# Patient Record
Sex: Female | Born: 1965 | Race: White | Hispanic: No | Marital: Single
Health system: Midwestern US, Community
[De-identification: ages and names within clinical notes are randomized; demographics above are authoritative.]

## PROBLEM LIST (undated history)

## (undated) DIAGNOSIS — M797 Fibromyalgia: Secondary | ICD-10-CM

## (undated) DIAGNOSIS — K859 Acute pancreatitis without necrosis or infection, unspecified: Secondary | ICD-10-CM

## (undated) HISTORY — PX: CHOLECYSTECTOMY: SHX55

## (undated) HISTORY — PX: ABDOMINAL HYSTERECTOMY: SHX81

## (undated) HISTORY — PX: GASTROINTESTINAL STENT REMOVAL: SHX6384

## (undated) HISTORY — PX: APPENDECTOMY: SHX54

---

## 2014-05-24 ENCOUNTER — Inpatient Hospital Stay
Admit: 2014-05-24 | Discharge: 2014-05-24 | Disposition: A | Discharge status: Home / Self Care | Payer: MEDICAID | Attending: Emergency Medicine

## 2014-05-24 DIAGNOSIS — R51 Headache: Secondary | ICD-10-CM

## 2014-05-24 MED ORDER — METHOCARBAMOL 500 MG TAB
500 mg | ORAL_TABLET | Freq: Four times a day (QID) | ORAL | Status: DC
Start: 2014-05-24 — End: 2014-06-10

## 2014-05-24 MED ORDER — OXYCODONE-ACETAMINOPHEN 5 MG-325 MG TAB
5-325 mg | ORAL_TABLET | ORAL | Status: DC
Start: 2014-05-24 — End: 2014-06-10

## 2014-05-24 MED ORDER — ONDANSETRON 4 MG TAB, RAPID DISSOLVE
4 mg | ORAL_TABLET | Freq: Three times a day (TID) | ORAL | Status: AC | PRN
Start: 2014-05-24 — End: 2014-05-28

## 2014-05-24 NOTE — ED Notes (Signed)
Pt examined by Dr. Thana Farrartmill and cleared for DC. VSS. HA pain still 9/10. Patient is awake, alert, and oriented, speech is clear and patient is able to ambulate. Pt is ready for discharge. Verbal and written discharge instructions provided and pt has cognitive understanding of discharge instructions. Discharged home via ambulatory. All questions answered.

## 2014-05-24 NOTE — ED Notes (Addendum)
Pt c/o HA x3 days. + Vomiting since this am. Lights and sounds not bothering pt. + hx "muscle tension HA's". 6 months since HA this bad. Pt drove self here and no one can pick her up. Pt states she really just wants Rx for HA and vomiting to go. Pt just moved to area and [facility]. Pt tried Butalbital and Tyl she usually takes with no relief.

## 2014-05-24 NOTE — ED Provider Notes (Signed)
Patient is a 48 y.o. female presenting with headaches. The history is provided by the patient.   Headache  This is a recurrent problem. The current episode started more than 2 days ago. Associated symptoms include headaches. Pertinent negatives include no chest pain and no shortness of breath. Nothing aggravates the symptoms. Nothing relieves the symptoms.    Patient has been having 3 days of worsening diffuse headache with nausea similar to previous headaches.  No photophobia.  She is driving and does not want medication here     History reviewed. No pertinent past medical history.     Past Surgical History   Procedure Laterality Date   ??? Hx total abdominal hysterectomy     ??? Hx appendectomy     ??? Hx cholecystectomy           History reviewed. No pertinent family history.     History     Social History   ??? Marital Status: SINGLE     Spouse Name: N/A     Number of Children: N/A   ??? Years of Education: N/A     Occupational History   ??? Not on file.     Social History Main Topics   ??? Smoking status: Never Smoker    ??? Smokeless tobacco: Not on file   ??? Alcohol Use: Yes      Comment: occ   ??? Drug Use: No   ??? Sexual Activity: Not on file     Other Topics Concern   ??? Not on file     Social History Narrative   ??? No narrative on file                  ALLERGIES: Compazine; Nsaids (non-steroidal anti-inflammatory drug); Phenergan; and Toradol      Review of Systems   Eyes: Negative for pain.   Respiratory: Negative for cough, choking, chest tightness and shortness of breath.    Cardiovascular: Negative for chest pain and leg swelling.   Gastrointestinal: Negative for constipation, abdominal distention and anal bleeding.   Endocrine: Negative for cold intolerance, heat intolerance and polydipsia.   Genitourinary: Negative for frequency, hematuria, enuresis and pelvic pain.   Musculoskeletal: Negative for back pain, joint swelling, arthralgias and gait problem.   Skin: Negative for color change and pallor.    Allergic/Immunologic: Negative for environmental allergies and food allergies.   Neurological: Positive for headaches.       Filed Vitals:    05/24/14 1857   BP: 114/80   Pulse: 94   Temp: 98.7 ??F (37.1 ??C)   Resp: 20   Height: 5\' 9"  (1.753 m)   Weight: 61.236 kg (135 lb)   SpO2: 100%            Physical Exam   Constitutional: She is oriented to person, place, and time. She appears well-developed and well-nourished. No distress.   HENT:   Head: Normocephalic and atraumatic.   Mouth/Throat: Oropharynx is clear and moist.   Eyes: EOM are normal. Pupils are equal, round, and reactive to light.   Neck: Neck supple. No JVD present.   Cardiovascular: Normal rate, regular rhythm and normal heart sounds.    Pulmonary/Chest: Breath sounds normal. No respiratory distress.   Abdominal: Soft. Bowel sounds are normal. She exhibits no distension. There is no tenderness.   Musculoskeletal: Normal range of motion. She exhibits no edema.   Neurological: She is alert and oriented to person, place, and time. No cranial nerve deficit.  Skin: Skin is warm and dry. She is not diaphoretic. No pallor.   Nursing note and vitals reviewed.       MDM    Procedures    <EMERGENCY DEPARTMENT CASE SUMMARY>    Impression/Differential Diagnosis: Headache      ED Course: Patient while pick or scripts and return for worsening sx    Final Impression/Diagnosis: Headache    Patient condition at time of disposition: Stable      I have reviewed the following home medications:    Prior to Admission medications    Medication Sig Start Date End Date Taking? Authorizing Provider   butalbital-acetaminophen (PHRENILIN) 50-325 mg tablet Take  by mouth every six (6) hours as needed.   Yes Phys Other, MD   oxyCODONE-acetaminophen (PERCOCET) 5-325 mg per tablet 1 Tab Oral every 4 - 6 hours with food as needed for pain 05/24/14  Yes Leonia CoronaKeith W Lalonnie Shaffer, MD   methocarbamol (ROBAXIN) 500 mg tablet Take 1 Tab by mouth four (4) times  daily. 05/24/14  Yes Leonia CoronaKeith W Shantana Christon, MD   ondansetron (ZOFRAN ODT) 4 mg disintegrating tablet Take 1 Tab by mouth every eight (8) hours as needed for Nausea for up to 4 days. 05/24/14 05/28/14 Yes Leonia CoronaKeith W Vidur Knust, MD         Leonia CoronaKeith W Promise Weldin, MD

## 2014-06-10 ENCOUNTER — Emergency Department: Admit: 2014-06-11 | Payer: MEDICAID | Primary: Family Medicine

## 2014-06-10 ENCOUNTER — Inpatient Hospital Stay
Admit: 2014-06-10 | Discharge: 2014-06-11 | Disposition: A | Discharge status: Home / Self Care | Payer: MEDICAID | Attending: Emergency Medicine

## 2014-06-10 DIAGNOSIS — R109 Unspecified abdominal pain: Secondary | ICD-10-CM

## 2014-06-10 MED ORDER — SODIUM CHLORIDE 0.9 % IJ SYRG
INTRAMUSCULAR | Status: DC | PRN
Start: 2014-06-10 — End: 2014-06-11
  Administered 2014-06-11: 02:00:00 via INTRAVENOUS

## 2014-06-10 MED ORDER — HYDROMORPHONE (PF) 1 MG/ML IJ SOLN
1 mg/mL | Freq: Once | INTRAMUSCULAR | Status: AC
Start: 2014-06-10 — End: 2014-06-10
  Administered 2014-06-11: via INTRAVENOUS

## 2014-06-10 MED ORDER — ONDANSETRON (PF) 4 MG/2 ML INJECTION
4 mg/2 mL | Freq: Once | INTRAMUSCULAR | Status: AC
Start: 2014-06-10 — End: 2014-06-10
  Administered 2014-06-11: via INTRAVENOUS

## 2014-06-10 MED ORDER — SODIUM CHLORIDE 0.9% BOLUS IV
0.9 % | Freq: Once | INTRAVENOUS | Status: AC
Start: 2014-06-10 — End: 2014-06-11
  Administered 2014-06-11: via INTRAVENOUS

## 2014-06-10 MED FILL — SODIUM CHLORIDE 0.9 % IV: INTRAVENOUS | Qty: 1000

## 2014-06-10 MED FILL — NORMAL SALINE FLUSH 0.9 % INJECTION SYRINGE: INTRAMUSCULAR | Qty: 10

## 2014-06-10 NOTE — ED Notes (Signed)
Patient presents to ED for evaluation of lower abdominal pain and vomiting starting around 0200 this AM. Patient thought she was constipated took a ducalox had a small BM today. States whenever she sits to have BM feels like "my bladder is on fire". Driven by husband. Unable to tolerate PO.

## 2014-06-10 NOTE — ED Notes (Signed)
Pt medicated  As ordered   Iv infusing

## 2014-06-10 NOTE — ED Notes (Signed)
Pt can obnly give scant urine

## 2014-06-10 NOTE — ED Provider Notes (Addendum)
Patient is a 48 y.o. female presenting with abdominal pain and vomiting. The history is provided by the patient.   Abdominal Pain   This is a new problem. The current episode started yesterday. The problem occurs constantly. The problem has been gradually worsening. The pain is associated with vomiting. The pain is located in the LLQ. The quality of the pain is cramping. The pain is moderate. Associated symptoms include nausea and vomiting. Pertinent negatives include no anorexia, no fever, no belching, no diarrhea, no flatus, no hematochezia, no melena, no constipation, no dysuria, no frequency, no hematuria, no headaches, no arthralgias, no trauma, no testicular pain and no back pain.   Vomiting   Associated symptoms include abdominal pain. Pertinent negatives include no chills, no fever, no diarrhea, no headaches, no arthralgias and no headaches.    Patient has been having worsening cramping lower and llq moderate abdominal pain since yesterday with nv and less stools than normal.                                  History reviewed. No pertinent past medical history.;     Past Surgical History   Procedure Laterality Date   ??? Hx total abdominal hysterectomy     ??? Hx appendectomy     ??? Hx cholecystectomy           History reviewed. No pertinent family history.     History     Social History   ??? Marital Status: SINGLE     Spouse Name: N/A     Number of Children: N/A   ??? Years of Education: N/A     Occupational History   ??? Not on file.     Social History Main Topics   ??? Smoking status: Never Smoker    ??? Smokeless tobacco: Not on file   ??? Alcohol Use: Yes      Comment: rarely   ??? Drug Use: No   ??? Sexual Activity: Not on file     Other Topics Concern   ??? Not on file     Social History Narrative                  ALLERGIES: Compazine; Nsaids (non-steroidal anti-inflammatory drug); Phenergan; and Toradol      Review of Systems   Constitutional: Negative for fever, chills, diaphoresis and appetite change.    HENT: Negative for ear pain.    Eyes: Negative for pain and discharge.   Gastrointestinal: Positive for nausea, vomiting and abdominal pain. Negative for diarrhea, constipation, melena, hematochezia, anorexia and flatus.   Endocrine: Negative for cold intolerance and heat intolerance.   Genitourinary: Negative for dysuria, frequency, hematuria and testicular pain.   Musculoskeletal: Negative for back pain and arthralgias.   Skin: Negative for color change, pallor and rash.   Allergic/Immunologic: Negative for environmental allergies and food allergies.   Neurological: Negative for dizziness, facial asymmetry and headaches.   Hematological: Negative for adenopathy. Does not bruise/bleed easily.   Psychiatric/Behavioral: Negative for confusion, sleep disturbance, self-injury, dysphoric mood, decreased concentration and agitation.       Filed Vitals:    06/10/14 1928   BP: 123/78   Pulse: 87   Temp: 98.4 ??F (36.9 ??C)   Resp: 18   Height: 5' 9"  (1.753 m)   Weight: 61.236 kg (135 lb)   SpO2: 100%  Physical Exam   Constitutional: She is oriented to person, place, and time. She appears well-developed and well-nourished. No distress.   HENT:   Head: Normocephalic and atraumatic.   Mouth/Throat: Oropharynx is clear and moist.   Eyes: EOM are normal. Pupils are equal, round, and reactive to light.   Neck: Neck supple. No JVD present.   Cardiovascular: Normal rate, regular rhythm and normal heart sounds.    Pulmonary/Chest: Breath sounds normal. No respiratory distress.   Abdominal: Soft. Bowel sounds are normal. She exhibits no distension. There is tenderness (LLQ ttp).   Musculoskeletal: Normal range of motion. She exhibits no edema.   Neurological: She is alert and oriented to person, place, and time. No cranial nerve deficit.   Skin: Skin is warm and dry. She is not diaphoretic. No pallor.   Nursing note and vitals reviewed.       MDM  Number of Diagnoses or Management Options   Diagnosis management comments: The patient presents with abdominal pain with a differential diagnosis of abdominal pain, appendicitis, biliary colic, cholecystitis, diverticulitis, gastritis, gastroenteritis, pancreatitis, PUD, renal Colic, UTI and vomiting         Amount and/or Complexity of Data Reviewed  Clinical lab tests: reviewed and ordered  Tests in the radiology section of CPT??: reviewed and ordered  Tests in the medicine section of CPT??: reviewed and ordered    Risk of Complications, Morbidity, and/or Mortality  Presenting problems: high  Diagnostic procedures: high  Management options: high    Patient Progress  Patient progress: improved                          Procedures      EXAM:  CT Abdomen and Pelvis With Intravenous Contrast.  CLINICAL HISTORY:  48 years old, female; Abdominal pain  TECHNIQUE:  Axial computed tomography images of the abdomen and pelvis with intravenous   contrast.  Coronal and sagittal reformatted images were created and reviewed.  EXAM DATE/TIME:  Exam ordered 06/10/2014 8:33 PM  COMPARISON:  No relevant prior studies available.  FINDINGS:  Lower thorax: No acute findings.  ABDOMEN:  Liver: Unremarkable. No mass.  Gallbladder and bile ducts: Intrahepatic and extrahepatic biliary dilatation.  Common bile duct up to 16 mm. Cholecystectomy.  Pancreas: Distal pancreatic ductal dilatation up to 7 mm.  Spleen: Unremarkable. No splenomegaly.  Adrenals: Unremarkable. No mass.  Kidneys and ureters: Unremarkable. No hydronephrosis. No solid mass.  PELVIS:  Bladder: Unremarkable. No mass.  Reproductive: Hysterectomy.  Appendix: No findings to suggest acute appendicitis.  ABDOMEN & PELVIS:  Stomach and bowel: Copious stool in the left colon. Descending colon wall   thickening and loss of haustration. No obstruction.  Peritoneum: Unremarkable. No significant fluid collection. No free air.  Lymph nodes: Unremarkable. No enlarged lymph nodes.  Vasculature: Unremarkable. No aortic aneurysm.   Bones: No acute fracture.  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 Correlation   to clinical and laboratory parameters is suggested. Daytime MR and MRCP   suggested, if clinically warranted.   Constipation cannot be excluded.  Likely descending colitis.  THIS DOCUMENT HAS BEEN ELECTRONICALLY SIGNED BY ASTI [place] MD    Recent Results (from the past 12 hour(s))   URINALYSIS W/ RFLX MICROSCOPIC    Collection Time: 06/10/14  8:00 PM   Result Value Ref Range    Color YELLOW YEL      Appearance CLEAR CLEAR  Specific gravity >1.030 (H) 1.005 - 1.030    pH (UA) 6.0 4.5 - 8.0      Protein NEGATIVE  NEG mg/dL    Glucose NEGATIVE  NEG mg/dL    Ketone NEGATIVE  NEG mg/dL    Bilirubin NEGATIVE  NEG      Blood NEGATIVE  NEG      Urobilinogen 0.2 0.1 - 1.0 EU/dL    Nitrites NEGATIVE  NEG      Leukocyte Esterase NEGATIVE  NEG     CBC WITH AUTOMATED DIFF    Collection Time: 06/10/14  8:08 PM   Result Value Ref Range    WBC 12.1 (H) 4.8 - 10.8 K/uL    RBC 3.86 (L) 4.20 - 5.40 M/uL    HGB 12.0 12.0 - 16.0 g/dL    HCT 36.4 (L) 37.0 - 47.0 %    MCV 94.3 81.0 - 100.0 FL    MCH 31.1 (H) 27.0 - 31.0 PG    MCHC 33.0 30.5 - 36.0 g/dL    RDW 13.2 11.4 - 14.6 %    PLATELET 269 122 - 400 K/uL    MPV 10.3 10.2 - 12.7 FL    NEUTROPHILS PENDING %    LYMPHOCYTES PENDING %    MONOCYTES PENDING %    EOSINOPHILS PENDING %    BASOPHILS PENDING %    ABS. NEUTROPHILS PENDING K/UL    ABS. LYMPHOCYTES PENDING K/UL    ABS. MONOCYTES PENDING K/UL    ABS. EOSINOPHILS PENDING K/UL    ABS. BASOPHILS PENDING K/UL    DF PENDING    METABOLIC PANEL, COMPREHENSIVE    Collection Time: 06/10/14  8:08 PM   Result Value Ref Range    Sodium 140 136 - 145 mmol/L    Potassium 3.3 (L) 3.5 - 5.1 mmol/L    Chloride 102 98 - 107 mmol/L    CO2 30 21 - 32 mmol/L    Anion gap 8 8 - 20 mmol/L    Glucose 121 (H) 74 - 106 mg/dL    BUN 19 (H) 7 - 18 mg/dL    Creatinine 0.8 0.6 - 1.3 mg/dL    GFR est AA >60 >60 ml/min/1.23m     GFR est non-AA >60 >60 ml/min/1.771m   Calcium 9.9 8.5 - 10.1 mg/dL    Bilirubin, total 0.3 0.2 - 1.0 mg/dL    ALT 19 12 - 78 U/L    AST 16 15 - 37 U/L    Alk. phosphatase 92 46 - 116 U/L    Protein, total 7.4 6.4 - 8.2 g/dL    Albumin 4.4 3.4 - 5.0 g/dL    Globulin 3.0 2.5 - 5.0 g/dL    A-G Ratio 1.5 1.0 - 1.5         EXAM:  CT Abdomen and Pelvis With Intravenous Contrast.  CLINICAL HISTORY:  [age]ears old, female; Abdominal pain  TECHNIQUE:  Axial computed tomography images of the abdomen and pelvis with intravenous   contrast.  Coronal and sagittal reformatted images were created and reviewed.  EXAM DATE/TIME:  Exam ordered 06/10/2014 8:33 PM  COMPARISON:  No relevant prior studies available.  FINDINGS:  Lower thorax: No acute findings.  ABDOMEN:  Liver: Unremarkable. No mass.  Gallbladder and bile ducts: Intrahepatic and extrahepatic biliary dilatation.  Common bile duct up to 16 mm. Cholecystectomy.  Pancreas: Distal pancreatic ductal dilatation up to 7 mm.  Spleen: Unremarkable. No splenomegaly.  Adrenals: Unremarkable. No mass.  Kidneys  and ureters: Unremarkable. No hydronephrosis. No solid mass.  PELVIS:  Bladder: Unremarkable. No mass.  Reproductive: Hysterectomy.  Appendix: No findings to suggest acute appendicitis.  ABDOMEN & PELVIS:  Stomach and bowel: Copious stool in the left colon. Descending colon wall   thickening and loss of haustration. No obstruction.  Peritoneum: Unremarkable. No significant fluid collection. No free air.  Lymph nodes: Unremarkable. No enlarged lymph nodes.  Vasculature: Unremarkable. No aortic aneurysm.  Bones: No acute fracture.  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 Correlation   to clinical and laboratory parameters is suggested. Daytime MR and MRCP   suggested, if clinically warranted.   Constipation cannot be excluded.  Likely descending colitis.  THIS DOCUMENT HAS BEEN ELECTRONICALLY SIGNED BY ASTI [place] MD   <EMERGENCY DEPARTMENT CASE SUMMARY>    Impression/Differential Diagnosis: Abdominal pain    Plan: dc home    ED Course: abnormality discussed with patient and will follow up with GI.  She feels better and wishes to go home.      Final Impression/Diagnosis: Abdominal pain, vomiting, Dilated biliary duct    Patient condition at time of disposition: stable      I have reviewed the following home medications:    Prior to Admission medications    Medication Sig Start Date End Date Taking? Authorizing Provider   cyclobenzaprine (FLEXERIL) 5 mg tablet Take 5 mg by mouth.   Yes Phys Other, MD   hydrOXYzine (VISTARIL) 50 mg capsule Take 25 mg by mouth three (3) times daily as needed for Itching.   Yes Phys Other, MD         Denice Paradise, MD

## 2014-06-11 LAB — METABOLIC PANEL, COMPREHENSIVE
A-G Ratio: 1.5 (ref 1.0–1.5)
ALT (SGPT): 19 U/L (ref 12–78)
AST (SGOT): 16 U/L (ref 15–37)
Albumin: 4.4 g/dL (ref 3.4–5.0)
Alk. phosphatase: 92 U/L (ref 46–116)
Anion gap: 8 mmol/L (ref 8–20)
BUN: 19 mg/dL — ABNORMAL HIGH (ref 7–18)
Bilirubin, total: 0.3 mg/dL (ref 0.2–1.0)
CO2: 30 mmol/L (ref 21–32)
Calcium: 9.9 mg/dL (ref 8.5–10.1)
Chloride: 102 mmol/L (ref 98–107)
Creatinine: 0.8 mg/dL (ref 0.6–1.3)
GFR est AA: >60 mL/min/{1.73_m2} (ref >60)
GFR est non-AA: >60 mL/min/{1.73_m2} (ref >60)
Globulin: 3 g/dL (ref 2.5–5.0)
Glucose: 121 mg/dL — ABNORMAL HIGH (ref 74–106)
Potassium: 3.3 mmol/L — ABNORMAL LOW (ref 3.5–5.1)
Protein, total: 7.4 g/dL (ref 6.4–8.2)
Sodium: 140 mmol/L (ref 136–145)

## 2014-06-11 LAB — LIPASE: Lipase: 131 U/L (ref 73–393)

## 2014-06-11 LAB — CBC WITH AUTOMATED DIFF
ABS. LYMPHOCYTES: 1.1 10*3/uL
ABS. MONOCYTES: 0.8 10*3/uL
ABS. NEUTROPHILS: 10.2 10*3/uL
ATYPICAL LYMPHS: 2 %
BAND NEUTROPHILS: 6 % (ref 0–7)
HCT: 36.4 % — ABNORMAL LOW (ref 37.0–47.0)
HGB: 12 g/dL (ref 12.0–16.0)
LYMPHOCYTES: 7 % — ABNORMAL LOW (ref 21–51)
MCH: 31.1 PG — ABNORMAL HIGH (ref 27.0–31.0)
MCHC: 33 g/dL (ref 30.5–36.0)
MCV: 94.3 FL (ref 81.0–100.0)
MONOCYTES: 7 % (ref 2–9)
MPV: 10.3 FL (ref 10.2–12.7)
NEUTROPHILS: 78 % — ABNORMAL HIGH (ref 42–75)
PLATELET ESTIMATE: ADEQUATE
PLATELET: 269 10*3/uL (ref 122–400)
RBC: 3.86 M/uL — ABNORMAL LOW (ref 4.20–5.40)
RDW: 13.2 % (ref 11.4–14.6)
WBC: 12.1 10*3/uL — ABNORMAL HIGH (ref 4.8–10.8)

## 2014-06-11 LAB — URINALYSIS W/ RFLX MICROSCOPIC
Bilirubin: NEGATIVE
Blood: NEGATIVE
Glucose: NEGATIVE mg/dL
Ketone: NEGATIVE mg/dL
Leukocyte Esterase: NEGATIVE
Nitrites: NEGATIVE
Protein: NEGATIVE mg/dL
Specific gravity: >1.03 — ABNORMAL HIGH (ref 1.005–1.030)
Urobilinogen: 0.2 EU/dL (ref 0.1–1.0)
pH (UA): 6 (ref 4.5–8.0)

## 2014-06-11 LAB — AMYLASE: Amylase: 155 U/L — ABNORMAL HIGH (ref 25–115)

## 2014-06-11 MED ORDER — ONDANSETRON (PF) 4 MG/2 ML INJECTION
4 mg/2 mL | INTRAMUSCULAR | Status: AC
Start: 2014-06-11 — End: 2014-06-10
  Administered 2014-06-11: 03:00:00 via INTRAVENOUS

## 2014-06-11 MED ORDER — ONDANSETRON (PF) 4 MG/2 ML INJECTION
4 mg/2 mL | Freq: Once | INTRAMUSCULAR | Status: AC
Start: 2014-06-11 — End: 2014-06-11
  Administered 2014-06-11: 05:00:00 via INTRAVENOUS

## 2014-06-11 MED ORDER — IOHEXOL 240 MG/ML IV SOLN
240 mg iodine/mL | Freq: Once | INTRAVENOUS | Status: AC
Start: 2014-06-11 — End: 2014-06-10
  Administered 2014-06-11: 01:00:00 via ORAL

## 2014-06-11 MED ORDER — OXYCODONE-ACETAMINOPHEN 5 MG-325 MG TAB
5-325 mg | ORAL | Status: AC
Start: 2014-06-11 — End: 2014-06-11
  Administered 2014-06-11: 05:00:00 via ORAL

## 2014-06-11 MED ORDER — IOPAMIDOL 61 % IV SOLN
300 mg iodine /mL (61 %) | Freq: Once | INTRAVENOUS | Status: AC
Start: 2014-06-11 — End: 2014-06-10
  Administered 2014-06-11: 03:00:00 via INTRAVENOUS

## 2014-06-11 MED ORDER — HYDROMORPHONE (PF) 1 MG/ML IJ SOLN
1 mg/mL | Freq: Once | INTRAMUSCULAR | Status: AC
Start: 2014-06-11 — End: 2014-06-10
  Administered 2014-06-11: 02:00:00 via INTRAVENOUS

## 2014-06-11 MED ORDER — OXYCODONE-ACETAMINOPHEN 5 MG-325 MG TAB
5-325 mg | ORAL_TABLET | ORAL | Status: DC
Start: 2014-06-11 — End: 2014-08-09

## 2014-06-11 MED ORDER — ONDANSETRON (PF) 4 MG/2 ML INJECTION
4 mg/2 mL | Freq: Once | INTRAMUSCULAR | Status: AC
Start: 2014-06-11 — End: 2014-06-10

## 2014-06-11 MED ORDER — ONDANSETRON 4 MG TAB, RAPID DISSOLVE
4 mg | ORAL_TABLET | Freq: Three times a day (TID) | ORAL | Status: AC | PRN
Start: 2014-06-11 — End: 2014-06-15

## 2014-06-11 MED FILL — OMNIPAQUE 240 MG IODINE/ML INTRAVENOUS SOLUTION: 240 mg iodine/mL | INTRAVENOUS | Qty: 50

## 2014-06-11 MED FILL — HYDROMORPHONE (PF) 1 MG/ML IJ SOLN: 1 mg/mL | INTRAMUSCULAR | Qty: 1

## 2014-06-11 MED FILL — ONDANSETRON (PF) 4 MG/2 ML INJECTION: 4 mg/2 mL | INTRAMUSCULAR | Qty: 2

## 2014-06-11 MED FILL — OXYCODONE-ACETAMINOPHEN 5 MG-325 MG TAB: 5-325 mg | ORAL | Qty: 1

## 2014-06-11 NOTE — ED Notes (Signed)
Patient is awake, alert, and oriented, speech is clear and patient is able to ambulate and ready for discharge. Verbal and written discharge instructions provided and has the cognitive understanding of discharge instructions. Discharged home with family. All questions answered.

## 2014-06-13 ENCOUNTER — Emergency Department: Admit: 2014-06-14 | Payer: MEDICAID | Primary: Family Medicine

## 2014-06-13 DIAGNOSIS — R1084 Generalized abdominal pain: Secondary | ICD-10-CM

## 2014-06-13 NOTE — ED Provider Notes (Addendum)
HPI Comments: 48 y/o F states she has had lower abd pain x 3 days which is mainly right sided and radiates to the back; +nausea without vomiting; No CP or SOB; No trauma or injury; No rash; No fevers or chills; no urinary symptoms; No vaginal bleeding or discharge; No back pain; No numbness, weakness or tingling; No loss of bowel or bladder control; No diarrhea; No constipation; Pt states she was here 3 days ago and was discharged home;     Patient is a 48 y.o. female presenting with abdominal pain.   Abdominal Pain                                      History reviewed. No pertinent past medical history.;     Past Surgical History   Procedure Laterality Date   ??? Hx total abdominal hysterectomy     ??? Hx appendectomy     ??? Hx cholecystectomy           History reviewed. No pertinent family history.     History     Social History   ??? Marital Status: SINGLE     Spouse Name: N/A     Number of Children: N/A   ??? Years of Education: N/A     Occupational History   ??? Not on file.     Social History Main Topics   ??? Smoking status: Never Smoker    ??? Smokeless tobacco: Not on file   ??? Alcohol Use: Yes      Comment: rarely   ??? Drug Use: No   ??? Sexual Activity: Not on file     Other Topics Concern   ??? Not on file     Social History Narrative                  ALLERGIES: Compazine; Nsaids (non-steroidal anti-inflammatory drug); Phenergan; and Toradol      Review of Systems   Gastrointestinal: Positive for abdominal pain.       Constitutional: Negative.  Negative for fever and chills.   HENT: Negative.  Negative for hearing loss, ear pain, congestion, facial swelling, rhinorrhea, neck pain and neck stiffness.    Eyes: Negative.  Negative for photophobia, pain, redness, itching and visual disturbance.   Respiratory: Negative.  Negative for cough, choking, chest tightness, shortness of breath and wheezing.    Cardiovascular: Negative.  Negative for chest pain, palpitations and leg swelling.    Genitourinary: Negative.  Negative for dysuria, hematuria, flank pain, groin pain  Musculoskeletal: Negative.  Negative for back pain, joint swelling and gait problem.   Skin: Negative.  Negative for rash and wound.   Neurological: Negative.  Negative for dizziness, syncope, speech difficulty, weakness, light-headedness, numbness and headaches.   Psychiatric/Behavioral: Negative for confusion.   All other systems reviewed and are negative.    Filed Vitals:    06/13/14 2201   BP: 145/84   Pulse: 101   Temp: 98.1 ??F (36.7 ??C)   Resp: 20   Height: 5\' 9"  (1.753 m)   Weight: 61.236 kg (135 lb)   SpO2: 99%            Physical Exam       Nursing note and vitals reviewed.  Constitutional: Patient is oriented to person, place, and time. Patient appears well-developed and well-nourished. Patient appears distressed.   HENT: atraumatic;  Head: Normocephalic and atraumatic.  Mouth/Throat: Oropharynx is clear and moist. No oropharyngeal exudate.   Eyes: Conjunctivae are normal. Pupils are equal, round, and reactive to light.   Neck: Normal range of motion. Neck supple. No tracheal deviation present.   Cardiovascular: Normal rate, regular rhythm and normal heart sounds.    Pulmonary/Chest: Effort normal and breath sounds normal. No stridor. No respiratory distress. No wheezes. No rales. No rhonchi.   Abdominal: Soft. Patient exhibits no distension. There is mild to moderate tenderness in lower quadrants R>L. There is no rebound and no guarding. No CVA tenderness;  Musculoskeletal: Patient exhibits no edema and no tenderness.   FROM at all joints;   No deformity;  No vertebral tenderness;  Distal neurovasculature intact;   Neurological: Patient is alert and oriented to person, place, and time. No cranial nerve deficit.  Gait steady;   Skin: Skin is warm. No rash noted. Patient is not diaphoretic. No erythema. No ecchymoses;   Psychiatric: behavior is normal.       MDM  Number of Diagnoses or Management Options   Diagnosis management comments: Labs Reviewed  METABOLIC PANEL, COMPREHENSIVE - Abnormal; Notable for the following:      Bilirubin, total              0.1 (*)             All other components within normal limits  CBC WITH AUTOMATED DIFF - Abnormal; Notable for the following:      RBC                           3.63 (*)               HGB                           11.3 (*)               HCT                           35.3 (*)               MCH                           31.1 (*)            All other components within normal limits  URINALYSIS W/ RFLX MICROSCOPIC  LIPASE    CT abd/pelvis = no acute changes as per radiology;    Pt tolerated PO challenge and states she is feeling much better now; VSS;        Amount and/or Complexity of Data Reviewed  Clinical lab tests: reviewed and ordered  Tests in the radiology section of CPT??: ordered and reviewed                            Procedures

## 2014-06-13 NOTE — ED Notes (Signed)
Pt has back and abd and leg pain continuing from the visit a few days ago   sts started neurontin

## 2014-06-14 ENCOUNTER — Inpatient Hospital Stay
Admit: 2014-06-14 | Discharge: 2014-06-14 | Disposition: A | Discharge status: Home / Self Care | Payer: MEDICAID | Attending: Emergency Medicine

## 2014-06-14 LAB — METABOLIC PANEL, COMPREHENSIVE
A-G Ratio: 1.2 (ref 1.0–1.5)
ALT (SGPT): 19 U/L (ref 12–78)
AST (SGOT): 15 U/L (ref 15–37)
Albumin: 4.1 g/dL (ref 3.4–5.0)
Alk. phosphatase: 88 U/L (ref 46–116)
Anion gap: 9 mmol/L (ref 8–20)
BUN: 11 mg/dL (ref 7–18)
Bilirubin, total: 0.1 mg/dL — ABNORMAL LOW (ref 0.2–1.0)
CO2: 29 mmol/L (ref 21–32)
Calcium: 9.4 mg/dL (ref 8.5–10.1)
Chloride: 106 mmol/L (ref 98–107)
Creatinine: 0.9 mg/dL (ref 0.6–1.3)
GFR est AA: >60 mL/min/{1.73_m2} (ref >60)
GFR est non-AA: >60 mL/min/{1.73_m2} (ref >60)
Globulin: 3.4 g/dL (ref 2.5–5.0)
Glucose: 86 mg/dL (ref 74–106)
Potassium: 3.7 mmol/L (ref 3.5–5.1)
Protein, total: 7.5 g/dL (ref 6.4–8.2)
Sodium: 144 mmol/L (ref 136–145)

## 2014-06-14 LAB — URINALYSIS W/ RFLX MICROSCOPIC
Bilirubin: NEGATIVE
Blood: NEGATIVE
Glucose: NEGATIVE mg/dL
Ketone: NEGATIVE mg/dL
Leukocyte Esterase: NEGATIVE
Nitrites: NEGATIVE
Protein: NEGATIVE mg/dL
Specific gravity: 1.01 (ref 1.005–1.030)
Urobilinogen: 0.2 EU/dL (ref 0.1–1.0)
pH (UA): 7 (ref 4.5–8.0)

## 2014-06-14 LAB — CBC WITH AUTOMATED DIFF
ABS. BASOPHILS: 0 10*3/uL (ref 0.0–0.1)
ABS. EOSINOPHILS: 0.1 10*3/uL (ref 0.0–0.2)
ABS. LYMPHOCYTES: 2.2 10*3/uL (ref 1.0–5.5)
ABS. MONOCYTES: 0.4 10*3/uL (ref 0.1–1.0)
ABS. NEUTROPHILS: 3.9 10*3/uL (ref 2.0–8.1)
BASOPHILS: 0 % (ref 0.0–1.0)
EOSINOPHILS: 2 % (ref 0.0–2.0)
HCT: 35.3 % — ABNORMAL LOW (ref 37.0–47.0)
HGB: 11.3 g/dL — ABNORMAL LOW (ref 12.0–16.0)
LYMPHOCYTES: 33 % (ref 20.5–51.1)
MCH: 31.1 PG — ABNORMAL HIGH (ref 27.0–31.0)
MCHC: 32 g/dL (ref 30.5–36.0)
MCV: 97.2 FL (ref 81.0–100.0)
MONOCYTES: 6 % (ref 1.7–10.0)
MPV: 10.2 FL (ref 10.2–12.7)
NEUTROPHILS: 59 % (ref 42.2–75.2)
PLATELET: 274 10*3/uL (ref 122–400)
RBC: 3.63 M/uL — ABNORMAL LOW (ref 4.20–5.40)
RDW: 13.3 % (ref 11.4–14.6)
WBC: 6.7 10*3/uL (ref 4.8–10.8)

## 2014-06-14 LAB — LIPASE: Lipase: 197 U/L (ref 73–393)

## 2014-06-14 MED ORDER — DICYCLOMINE 20 MG TAB
20 mg | ORAL_TABLET | Freq: Four times a day (QID) | ORAL | Status: AC
Start: 2014-06-14 — End: 2014-06-19

## 2014-06-14 MED ORDER — ONDANSETRON (PF) 4 MG/2 ML INJECTION
4 mg/2 mL | Freq: Once | INTRAMUSCULAR | Status: AC
Start: 2014-06-14 — End: 2014-06-13
  Administered 2014-06-14: 04:00:00 via INTRAVENOUS

## 2014-06-14 MED ORDER — IOPAMIDOL 61 % IV SOLN
300 mg iodine /mL (61 %) | Freq: Once | INTRAVENOUS | Status: AC
Start: 2014-06-14 — End: 2014-06-14
  Administered 2014-06-14: 05:00:00 via INTRAVENOUS

## 2014-06-14 MED ORDER — DICYCLOMINE 10 MG/ML IM SOLN
10 mg/mL | Freq: Once | INTRAMUSCULAR | Status: AC
Start: 2014-06-14 — End: 2014-06-14
  Administered 2014-06-14: 06:00:00 via INTRAMUSCULAR

## 2014-06-14 MED ORDER — SODIUM CHLORIDE 0.9% BOLUS IV
0.9 % | Freq: Once | INTRAVENOUS | Status: AC
Start: 2014-06-14 — End: 2014-06-13
  Administered 2014-06-14: 04:00:00 via INTRAVENOUS

## 2014-06-14 MED ORDER — HYDROMORPHONE (PF) 2 MG/ML IJ SOLN
2 mg/mL | Freq: Once | INTRAMUSCULAR | Status: AC
Start: 2014-06-14 — End: 2014-06-13
  Administered 2014-06-14: 04:00:00 via INTRAVENOUS

## 2014-06-14 MED FILL — HYDROMORPHONE (PF) 2 MG/ML IJ SOLN: 2 mg/mL | INTRAMUSCULAR | Qty: 1

## 2014-06-14 MED FILL — SODIUM CHLORIDE 0.9 % IV: INTRAVENOUS | Qty: 1000

## 2014-06-14 MED FILL — BENTYL 10 MG/ML INTRAMUSCULAR SOLUTION: 10 mg/mL | INTRAMUSCULAR | Qty: 2

## 2014-06-14 MED FILL — ONDANSETRON (PF) 4 MG/2 ML INJECTION: 4 mg/2 mL | INTRAMUSCULAR | Qty: 2

## 2014-06-14 NOTE — ED Notes (Signed)
Discharge instructions reviewed with patient and written instructions given. Pain management per written instructions

## 2014-08-09 ENCOUNTER — Emergency Department: Admit: 2014-08-09 | Payer: MEDICAID | Primary: Family Medicine

## 2014-08-09 ENCOUNTER — Inpatient Hospital Stay
Admit: 2014-08-09 | Discharge: 2014-08-10 | Disposition: A | Discharge status: Home / Self Care | Payer: MEDICAID | Attending: Emergency Medicine

## 2014-08-09 DIAGNOSIS — R05 Cough: Secondary | ICD-10-CM

## 2014-08-09 LAB — METABOLIC PANEL, COMPREHENSIVE
A-G Ratio: 1.3 (ref 1.0–1.5)
ALT (SGPT): 18 U/L (ref 12–78)
AST (SGOT): 13 U/L — ABNORMAL LOW (ref 15–37)
Albumin: 4.6 g/dL (ref 3.4–5.0)
Alk. phosphatase: 102 U/L (ref 46–116)
Anion gap: 11 mmol/L (ref 8–20)
BUN: 13 mg/dL (ref 7–18)
Bilirubin, total: 0.2 mg/dL (ref 0.2–1.0)
CO2: 28 mmol/L (ref 21–32)
Calcium: 10.6 mg/dL — ABNORMAL HIGH (ref 8.5–10.1)
Chloride: 104 mmol/L (ref 98–107)
Creatinine: 0.95 mg/dL (ref 0.6–1.3)
GFR est AA: >60 mL/min/{1.73_m2} (ref >60)
GFR est non-AA: >60 mL/min/{1.73_m2} (ref >60)
Globulin: 3.6 g/dL (ref 2.5–5.0)
Glucose: 101 mg/dL (ref 74–106)
Potassium: 3.4 mmol/L — ABNORMAL LOW (ref 3.5–5.1)
Protein, total: 8.2 g/dL (ref 6.4–8.2)
Sodium: 143 mmol/L (ref 136–145)

## 2014-08-09 LAB — CBC WITH AUTOMATED DIFF
ABS. BASOPHILS: 0 10*3/uL (ref 0.0–0.1)
ABS. EOSINOPHILS: 0 10*3/uL (ref 0.0–0.2)
ABS. LYMPHOCYTES: 1.7 10*3/uL (ref 1.0–5.5)
ABS. MONOCYTES: 0.4 10*3/uL (ref 0.1–1.0)
ABS. NEUTROPHILS: 4.4 10*3/uL (ref 2.0–8.1)
BASOPHILS: 0 % (ref 0.0–1.0)
EOSINOPHILS: 0 % (ref 0.0–2.0)
HCT: 40 % (ref 37.0–47.0)
HGB: 12.9 g/dL (ref 12.0–16.0)
LYMPHOCYTES: 26 % (ref 20.5–51.1)
MCH: 30.6 PG (ref 27.0–31.0)
MCHC: 32.3 g/dL (ref 30.5–36.0)
MCV: 94.8 FL (ref 81.0–100.0)
MONOCYTES: 7 % (ref 1.7–10.0)
MPV: 9.7 FL — ABNORMAL LOW (ref 10.2–12.7)
NEUTROPHILS: 67 % (ref 42.2–75.2)
PLATELET: 340 10*3/uL (ref 122–400)
RBC: 4.22 M/uL (ref 4.20–5.40)
RDW: 13.3 % (ref 11.4–14.6)
WBC: 6.6 10*3/uL (ref 4.8–10.8)

## 2014-08-09 LAB — URINE MICROSCOPIC

## 2014-08-09 LAB — HCG QL SERUM: HCG, Ql.: NEGATIVE

## 2014-08-09 LAB — INFLUENZA A & B AG (RAPID TEST)
Influenza A Ag: NEGATIVE
Influenza B Ag: NEGATIVE

## 2014-08-09 LAB — TROPONIN I: Troponin-I, Qt.: <0.04 NG/ML (ref 0.00–0.09)

## 2014-08-09 LAB — LIPASE: Lipase: 246 U/L (ref 73–393)

## 2014-08-09 MED ORDER — ALBUTEROL SULFATE HFA 90 MCG/ACTUATION AEROSOL INHALER
90 mcg/actuation | Freq: Four times a day (QID) | RESPIRATORY_TRACT | Status: AC | PRN
Start: 2014-08-09 — End: ?

## 2014-08-09 MED ORDER — ONDANSETRON (PF) 4 MG/2 ML INJECTION
4 mg/2 mL | INTRAMUSCULAR | Status: AC
Start: 2014-08-09 — End: 2014-08-09
  Administered 2014-08-09: 23:00:00 via INTRAVENOUS

## 2014-08-09 MED ORDER — FAMOTIDINE 20 MG TAB
20 mg | ORAL_TABLET | Freq: Two times a day (BID) | ORAL | Status: AC
Start: 2014-08-09 — End: 2014-08-19

## 2014-08-09 MED ORDER — IPRATROPIUM-ALBUTEROL 2.5 MG-0.5 MG/3 ML NEB SOLUTION
2.5 mg-0.5 mg/3 ml | RESPIRATORY_TRACT | Status: DC
Start: 2014-08-09 — End: 2014-08-09
  Administered 2014-08-09 (×2): via RESPIRATORY_TRACT

## 2014-08-09 MED ORDER — ACETAMINOPHEN-CODEINE 300 MG-30 MG TAB
300-30 mg | ORAL_TABLET | Freq: Four times a day (QID) | ORAL | Status: DC | PRN
Start: 2014-08-09 — End: 2014-08-24

## 2014-08-09 MED ORDER — SODIUM CHLORIDE 0.9 % IV
INTRAVENOUS | Status: DC
Start: 2014-08-09 — End: 2014-08-09
  Administered 2014-08-09: 23:00:00 via INTRAVENOUS

## 2014-08-09 MED FILL — IPRATROPIUM-ALBUTEROL 2.5 MG-0.5 MG/3 ML NEB SOLUTION: 2.5 mg-0.5 mg/3 ml | RESPIRATORY_TRACT | Qty: 3

## 2014-08-09 MED FILL — ONDANSETRON (PF) 4 MG/2 ML INJECTION: 4 mg/2 mL | INTRAMUSCULAR | Qty: 2

## 2014-08-09 MED FILL — SODIUM CHLORIDE 0.9 % IV: INTRAVENOUS | Qty: 1000

## 2014-08-09 NOTE — ED Notes (Signed)
Pt cleared for Dc by Dr. Andres ShadVanroekens. VSS. Pain still 8/10 to upper chest and back. Pt states she is driving and will get pain meds from pharmacy. IV DC'd. Patient is awake, alert, and oriented, speech is clear and patient is able to ambulate. Pt is ready for discharge. Verbal and written discharge instructions provided and pt  has cognitive understanding of discharge instructions. Discharged home via ambulatory.  All questions answered.

## 2014-08-09 NOTE — ED Notes (Signed)
Pt to ed with c/o coughing and once starts coughing spasm vomits mucus clear fluid

## 2014-08-09 NOTE — ED Notes (Signed)
Verbal shift change report given to deb freeman rn and emily kearns rn (oncoming nurse) by deb mcgovern rn (offgoing nurse). Report included the following information SBAR, ED Summary and MAR.

## 2014-08-09 NOTE — ED Notes (Signed)
Advised xray that pts preg is negative

## 2014-08-09 NOTE — ED Notes (Signed)
Pt to br to attempt sample

## 2014-08-09 NOTE — ED Provider Notes (Addendum)
HPI Comments: This pt has had cold and cough for about 3 weeks and has had one course of antibiotics but not resolved. Pt continues to cough     Patient is a 48 y.o. female presenting with cough. The history is provided by the patient.   Cough  This is a new problem. The current episode started more than 1 week ago. The problem occurs constantly. The problem has not changed since onset.The cough is non-productive. Patient reports a subjective fever - was not measured.Associated symptoms include sore throat, shortness of breath, wheezing, nausea and vomiting. Pertinent negatives include no chest pain, no chills, no weight loss, no eye redness, no ear congestion, no headaches, no rhinorrhea, no myalgias and no confusion. She has tried prescription drugs for the symptoms. The treatment provided no relief. She is not a smoker. Her past medical history is significant for bronchitis.        No past medical history on file.    Past Surgical History:   Procedure Laterality Date   ??? Hx total abdominal hysterectomy     ??? Hx appendectomy     ??? Hx cholecystectomy     ??? Hx orthopaedic       nerve damage to back   ??? Hx gi       perf ulcer         No family history on file.    History     Social History   ??? Marital Status: SINGLE     Spouse Name: N/A     Number of Children: N/A   ??? Years of Education: N/A     Occupational History   ??? Not on file.     Social History Main Topics   ??? Smoking status: Never Smoker    ??? Smokeless tobacco: Not on file   ??? Alcohol Use: Yes      Comment: rarely   ??? Drug Use: No   ??? Sexual Activity: Not on file     Other Topics Concern   ??? Not on file     Social History Narrative                ALLERGIES: Compazine; Nsaids (non-steroidal anti-inflammatory drug); Phenergan; and Toradol      Review of Systems   Constitutional: Negative for fever, chills and weight loss.   HENT: Positive for sore throat. Negative for congestion and rhinorrhea.    Eyes: Negative for pain, redness and visual disturbance.    Respiratory: Positive for cough, shortness of breath and wheezing.    Cardiovascular: Negative for chest pain and palpitations.   Gastrointestinal: Positive for nausea and vomiting. Negative for abdominal pain and blood in stool.   Genitourinary: Negative for dysuria, frequency and difficulty urinating.   Musculoskeletal: Negative for myalgias and back pain.   Skin: Negative for color change and rash.   Neurological: Negative for seizures and headaches.   Psychiatric/Behavioral: Negative for confusion, self-injury and dysphoric mood.       Filed Vitals:    08/09/14 1720 08/09/14 1721   BP:  119/85   Pulse:  91   Temp:  98.5 ??F (36.9 ??C)   Resp:  16   Height: 5\' 9"  (1.753 m)    Weight: 56.7 kg (125 lb)    SpO2:  99%            Physical Exam   Constitutional: She is oriented to person, place, and time. She appears well-developed and well-nourished. No distress.  HENT:   Head: Normocephalic and atraumatic.   Eyes: Conjunctivae are normal. Pupils are equal, round, and reactive to light. No scleral icterus.   Neck: Normal range of motion. Neck supple. No tracheal deviation present.   Cardiovascular: Normal rate and regular rhythm.    No murmur heard.  Pulmonary/Chest: Effort normal and breath sounds normal. No respiratory distress. She has no wheezes.   Abdominal: Soft. Bowel sounds are normal. There is no tenderness.   Musculoskeletal: Normal range of motion. She exhibits no edema.   Neurological: She is alert and oriented to person, place, and time. No cranial nerve deficit. She exhibits normal muscle tone.   Skin: Skin is warm and dry. No rash noted. She is not diaphoretic.   Psychiatric: She has a normal mood and affect. Her behavior is normal.   Nursing note and vitals reviewed.       MDM    EKG    Date/Time: 08/09/2014 6:02 PM  Performed by: Montey HoraVANROEKENS, Jacquelynn Friend  Authorized by: Montey HoraVANROEKENS, Apollo Timothy  Interpreted by ED physician  Comparison: not compared with previous ECG   Rhythm: sinus rhythm  Rate: normal  BPM: 79   QRS axis: normal  Conduction: conduction normal  ST Segments: ST segments normal  T Waves: T waves normal  Clinical impression: normal ECG and non-specific ECG      <EMERGENCY DEPARTMENT CASE SUMMARY>    Impression/Differential Diagnosis: cough and uri     Plan: imaging , ro flu ro pna     ED Course: ekg 79nsr     Recent Results (from the past 24 hour(s))   EKG, 12 LEAD, INITIAL    Collection Time: 08/09/14  5:27 PM   Result Value Ref Range    Ventricular Rate 79 BPM    Atrial Rate 79 BPM    P-R Interval 138 ms    QRS Duration 84 ms    Q-T Interval 358 ms    QTC Calculation (Bezet) 410 ms    Calculated P Axis 64 degrees    Calculated R Axis 66 degrees    Calculated T Axis 24 degrees    Diagnosis       Normal sinus rhythm  Possible Left atrial enlargement  Nonspecific ST abnormality  Abnormal ECG  No previous ECGs available         Final Impression/Diagnosis:   Cough     Patient condition at time of disposition: stable       I have reviewed the following home medications:    Prior to Admission medications    Medication Sig Start Date End Date Taking? Authorizing Provider   gabapentin (NEURONTIN) 100 mg capsule Take  by mouth three (3) times daily.   Yes Phys Other, MD   hydrOXYzine (VISTARIL) 50 mg capsule Take 25 mg by mouth three (3) times daily as needed for Itching.   Yes Phys Other, MD         Montey Horaraig Mistey Hoffert, MD

## 2014-08-10 LAB — EKG, 12 LEAD, INITIAL
Atrial Rate: 79 {beats}/min
Calculated P Axis: 64 degrees
Calculated R Axis: 66 degrees
Calculated T Axis: 24 degrees
Diagnosis: NORMAL
P-R Interval: 138 ms
Q-T Interval: 358 ms
QRS Duration: 84 ms
QTC Calculation (Bezet): 410 ms
Ventricular Rate: 79 {beats}/min

## 2014-08-10 LAB — URINALYSIS W/ RFLX MICROSCOPIC
Bilirubin: NEGATIVE
Blood: NEGATIVE
Glucose: NEGATIVE mg/dL
Nitrites: NEGATIVE
Protein: NEGATIVE mg/dL
Specific gravity: 1.02 (ref 1.005–1.030)
Urobilinogen: 0.2 EU/dL (ref 0.1–1.0)
pH (UA): 7 (ref 4.5–8.0)

## 2014-08-24 ENCOUNTER — Inpatient Hospital Stay
Admit: 2014-08-24 | Discharge: 2014-08-24 | Disposition: A | Discharge status: Home / Self Care | Payer: MEDICAID | Attending: Emergency Medicine

## 2014-08-24 ENCOUNTER — Emergency Department: Admit: 2014-08-24 | Payer: MEDICAID | Primary: Family Medicine

## 2014-08-24 DIAGNOSIS — G8929 Other chronic pain: Secondary | ICD-10-CM

## 2014-08-24 MED ORDER — CYCLOBENZAPRINE 10 MG TAB
10 mg | ORAL | Status: AC
Start: 2014-08-24 — End: 2014-08-24
  Administered 2014-08-24: 22:00:00 via ORAL

## 2014-08-24 MED ORDER — METHOCARBAMOL 750 MG TAB
750 mg | ORAL_TABLET | Freq: Three times a day (TID) | ORAL | Status: AC
Start: 2014-08-24 — End: ?

## 2014-08-24 MED ORDER — OXYCODONE-ACETAMINOPHEN 5 MG-325 MG TAB
5-325 mg | ORAL | Status: AC
Start: 2014-08-24 — End: 2014-08-24
  Administered 2014-08-24: 22:00:00 via ORAL

## 2014-08-24 MED ORDER — CYCLOBENZAPRINE 10 MG TAB
10 mg | ORAL_TABLET | Freq: Three times a day (TID) | ORAL | Status: DC | PRN
Start: 2014-08-24 — End: 2014-08-24

## 2014-08-24 MED ORDER — LIDOCAINE 5 % (700 MG/PATCH) ADHESIVE PATCH
5 % | PACK | CUTANEOUS | Status: AC
Start: 2014-08-24 — End: 2014-08-29

## 2014-08-24 MED FILL — CYCLOBENZAPRINE 10 MG TAB: 10 mg | ORAL | Qty: 1

## 2014-08-24 MED FILL — OXYCODONE-ACETAMINOPHEN 5 MG-325 MG TAB: 5-325 mg | ORAL | Qty: 1

## 2014-08-24 NOTE — ED Notes (Signed)
Patient is awake, alert, and oriented, speech is clear and patient is able to ambulate (if applicable) and ready for discharge. Verbal and written discharge instructions provided and has the cognitive understanding of discharge instructions. Discharged home with family. All questions answered.  Patient driven home by husband

## 2014-08-24 NOTE — ED Notes (Signed)
Xray mod stool nl spine

## 2014-08-24 NOTE — ED Notes (Signed)
C/o chronic mid back pain at [facility] that got severe today, states she had xrays last Friday and MD called today to tell her results and that MD wanted a dexascan. States she did not prescribe her any pain medicine. Patient appears anxious and is tearful.

## 2014-08-24 NOTE — ED Provider Notes (Addendum)
HPI Comments: This pt allegedly from [place] with visits to the ED typically for pain , co back pain and was seen at [facility] recently and still having back pain. Pt denies any trauma. Pt walks in without any issue, and almost ran in from the car and is having some shakes and is on vistaril she states for anxiety.     Pt denies bowel or bladder problems     Pt denies taking any pain medicines recently but admits to being in pain management in the past.           Patient is a 49 y.o. female presenting with back pain. The history is provided by the patient.   Back Pain   This is a chronic problem. The current episode started more than 2 days ago. The problem has been gradually worsening. Patient reports not work related injury.The pain does not radiate. The pain is severe. Pertinent negatives include no chest pain, no fever, no headaches, no abdominal pain and no dysuria.        History reviewed. No pertinent past medical history.    Past Surgical History:   Procedure Laterality Date   ??? Hx total abdominal hysterectomy     ??? Hx appendectomy     ??? Hx cholecystectomy     ??? Hx orthopaedic       nerve damage to back   ??? Hx gi       perf ulcer         History reviewed. No pertinent family history.    History     Social History   ??? Marital Status: SINGLE     Spouse Name: N/A     Number of Children: N/A   ??? Years of Education: N/A     Occupational History   ??? Not on file.     Social History Main Topics   ??? Smoking status: Never Smoker    ??? Smokeless tobacco: Not on file   ??? Alcohol Use: Yes      Comment: rarely   ??? Drug Use: No   ??? Sexual Activity: Not on file     Other Topics Concern   ??? Not on file     Social History Narrative                ALLERGIES: Compazine; Nsaids (non-steroidal anti-inflammatory drug); Phenergan; and Toradol      Review of Systems   Constitutional: Negative for fever and chills.   HENT: Negative for congestion and rhinorrhea.    Eyes: Negative for pain and visual disturbance.    Respiratory: Negative for cough and shortness of breath.    Cardiovascular: Negative for chest pain and palpitations.   Gastrointestinal: Negative for abdominal pain and blood in stool.   Genitourinary: Negative for dysuria, frequency and difficulty urinating.   Musculoskeletal: Positive for back pain. Negative for myalgias.   Skin: Negative for color change and rash.   Neurological: Negative for seizures and headaches.   Psychiatric/Behavioral: Negative for self-injury and dysphoric mood.       Filed Vitals:    08/24/14 1650 08/24/14 1651   BP:  128/86   Pulse:  90   Temp:  97.9 ??F (36.6 ??C)   Resp:  18   Height: 5\' 9"  (1.753 m)    Weight: 58.968 kg (130 lb)    SpO2:  97%            Physical Exam   Constitutional: She is oriented  to person, place, and time. She appears well-developed and well-nourished. No distress.   HENT:   Head: Normocephalic and atraumatic.   Eyes: Conjunctivae are normal. Pupils are equal, round, and reactive to light. No scleral icterus.   Neck: Normal range of motion. Neck supple. No tracheal deviation present.   Cardiovascular: Normal rate, regular rhythm and intact distal pulses.    No murmur heard.  Pulmonary/Chest: Effort normal and breath sounds normal. No respiratory distress. She has no wheezes.   Abdominal: Soft. Bowel sounds are normal. She exhibits no distension. There is no tenderness.   Musculoskeletal: Normal range of motion. She exhibits no edema.   Right arm sl scarring antecubital cw track mark    Neurological: She is alert and oriented to person, place, and time. She has normal reflexes.   Skin: Skin is warm and dry. No rash noted. She is not diaphoretic.   Psychiatric: She has a normal mood and affect. Her behavior is normal.   Nursing note and vitals reviewed.       MDM    Procedures    <EMERGENCY DEPARTMENT CASE SUMMARY>    Impression/Differential Diagnosis: back pain with all the allergies and PMP reviewed with recent narcotic scrips on 1/7 14 percocets and on 1/6  xanax 10 and on 08/14/2014 hydrocodone homatropine script.     Plan: uds, ls spine one percocet and then no narcotic scripts but will  Refer to pain managemetn     ED Course:   Plain films look fine    5:20 PM  Pt is told that in fact she has seen several providers recently and gotten controlled substances and I have a concern about this. Pt understands but then asks for something to help her calm down. I explained that she could take the vistaril and that i want her to followup with pain management.    5:22 PM pt then asks for robaxin rather than flexeril. Will oblige her in this.         Final Impression/Diagnosis:   1. Chronic low back pain         Patient condition at time of disposition: stable       I have reviewed the following home medications:    Prior to Admission medications    Medication Sig Start Date End Date Taking? Authorizing Provider   albuterol (PROVENTIL HFA, VENTOLIN HFA, PROAIR HFA) 90 mcg/actuation inhaler Take 2 Puffs by inhalation every six (6) hours as needed for Wheezing. 08/09/14  Yes Montey Hora, MD   gabapentin (NEURONTIN) 100 mg capsule Take  by mouth three (3) times daily.   Yes Phys Other, MD   hydrOXYzine (VISTARIL) 50 mg capsule Take 25 mg by mouth three (3) times daily as needed for Itching.   Yes Phys Other, MD         Montey Hora, MD

## 2014-08-24 NOTE — ED Notes (Signed)
Patient provided urine sample before Percocet given - taken to xray via wheelchair

## 2014-08-25 LAB — DRUG SCREEN, URINE
AMPHETAMINES: NEGATIVE
BARBITURATES: POSITIVE — AB
BENZODIAZEPINES: NEGATIVE
COCAINE: NEGATIVE
METHADONE: NEGATIVE
Methamphetamines: NEGATIVE
OPIATES: NEGATIVE
THC (TH-CANNABINOL): NEGATIVE
TRICYCLICS: NEGATIVE

## 2014-08-25 NOTE — Progress Notes (Signed)
Quick Note:        Pt despite stating no meds positive screen.        ______

## 2016-09-27 ENCOUNTER — Encounter: Payer: Self-pay | Admitting: *Deleted

## 2016-09-27 ENCOUNTER — Emergency Department: Payer: Self-pay

## 2016-09-27 ENCOUNTER — Emergency Department
Admission: EM | Admit: 2016-09-27 | Discharge: 2016-09-28 | Disposition: A | Discharge status: Home / Self Care | Payer: Self-pay | Attending: Emergency Medicine | Admitting: Emergency Medicine

## 2016-09-27 DIAGNOSIS — K859 Acute pancreatitis without necrosis or infection, unspecified: Secondary | ICD-10-CM

## 2016-09-27 LAB — COMPREHENSIVE METABOLIC PANEL
ALBUMIN: 4.8 g/dL (ref 3.5–5.0)
ALK PHOS: 78 U/L (ref 38–126)
ALT: 9 U/L — ABNORMAL LOW (ref 14–54)
ANION GAP: 8 (ref 5–15)
AST: 15 U/L (ref 15–41)
BUN: 22 mg/dL — ABNORMAL HIGH (ref 6–20)
CHLORIDE: 102 mmol/L (ref 101–111)
CO2: 28 mmol/L (ref 22–32)
Calcium: 10.2 mg/dL (ref 8.9–10.3)
Creatinine, Ser: 0.8 mg/dL (ref 0.44–1.00)
GFR calc Af Amer: >60 mL/min (ref >60)
GFR calc non Af Amer: >60 mL/min (ref >60)
GLUCOSE: 98 mg/dL (ref 65–99)
POTASSIUM: 5.4 mmol/L — AB (ref 3.5–5.1)
SODIUM: 138 mmol/L (ref 135–145)
Total Bilirubin: 0.3 mg/dL (ref 0.3–1.2)
Total Protein: 8 g/dL (ref 6.5–8.1)

## 2016-09-27 LAB — URINALYSIS, COMPLETE (UACMP) WITH MICROSCOPIC
BACTERIA UA: NONE SEEN
Bilirubin Urine: NEGATIVE
GLUCOSE, UA: NEGATIVE mg/dL
Hgb urine dipstick: NEGATIVE
KETONES UR: NEGATIVE mg/dL
Nitrite: NEGATIVE
PROTEIN: NEGATIVE mg/dL
Specific Gravity, Urine: 1.027 (ref 1.005–1.030)
pH: 5 (ref 5.0–8.0)

## 2016-09-27 LAB — CBC
HEMATOCRIT: 33.9 % — AB (ref 35.0–47.0)
HEMOGLOBIN: 10.9 g/dL — AB (ref 12.0–16.0)
MCH: 28.6 pg (ref 26.0–34.0)
MCHC: 32.1 g/dL (ref 32.0–36.0)
MCV: 89.2 fL (ref 80.0–100.0)
Platelets: 308 10*3/uL (ref 150–440)
RBC: 3.8 MIL/uL (ref 3.80–5.20)
RDW: 14.9 % — ABNORMAL HIGH (ref 11.5–14.5)
WBC: 4.7 10*3/uL (ref 3.6–11.0)

## 2016-09-27 LAB — LIPASE, BLOOD: Lipase: 78 U/L — ABNORMAL HIGH (ref 11–51)

## 2016-09-27 MED ORDER — LORAZEPAM 1 MG PO TABS: 1.0000 mg | ORAL_TABLET | Freq: Two times a day (BID) | ORAL | 0 refills | Status: DC | PRN

## 2016-09-27 MED ORDER — ONDANSETRON 4 MG PO TBDP
4.0000 mg | ORAL_TABLET | Freq: Once | ORAL | Status: AC | PRN
  Administered 2016-09-27: 4 mg via ORAL

## 2016-09-27 MED ORDER — HYDROMORPHONE HCL 1 MG/ML IJ SOLN
1.0000 mg | Freq: Once | INTRAMUSCULAR | Status: AC
  Administered 2016-09-27: 1 mg via INTRAVENOUS
  Filled 2016-09-27: qty 1

## 2016-09-27 MED ORDER — LORAZEPAM 2 MG/ML IJ SOLN
1.0000 mg | Freq: Once | INTRAMUSCULAR | Status: AC
  Administered 2016-09-27: 1 mg via INTRAVENOUS
  Filled 2016-09-27: qty 1

## 2016-09-27 MED ORDER — TRAMADOL HCL 50 MG PO TABS: 50.0000 mg | ORAL_TABLET | Freq: Four times a day (QID) | ORAL | 0 refills | Status: DC | PRN

## 2016-09-27 MED ORDER — SODIUM CHLORIDE 0.9 % IV BOLUS (SEPSIS)
1000.0000 mL | Freq: Once | INTRAVENOUS | Status: AC
  Administered 2016-09-27: 1000 mL via INTRAVENOUS

## 2016-09-27 MED ORDER — ONDANSETRON HCL 4 MG/2ML IJ SOLN
4.0000 mg | Freq: Once | INTRAMUSCULAR | Status: AC
  Administered 2016-09-27: 4 mg via INTRAVENOUS
  Filled 2016-09-27: qty 2

## 2016-09-27 MED ORDER — ONDANSETRON 4 MG PO TBDP: 4.0000 mg | ORAL_TABLET | Freq: Three times a day (TID) | ORAL | 0 refills | Status: DC | PRN

## 2016-09-27 MED ORDER — ONDANSETRON 4 MG PO TBDP
ORAL_TABLET | ORAL | Status: AC
  Filled 2016-09-27: qty 1

## 2016-09-27 NOTE — ED Notes (Addendum)
Pt to restroom; gait slightly unsteady. Standby assist provided. No distress noted. Pt states her pain has improved.

## 2016-09-27 NOTE — ED Notes (Signed)
Pt up to restroom with steady gait. No distress noted. Iv fluids infusing  

## 2016-09-27 NOTE — ED Triage Notes (Signed)
States abd pain with nausea for several days, states she has stents in her bile duct that were placed in December and was to have them removed but couldn't afford procedure, awake and alert in no acute distress

## 2016-09-27 NOTE — Discharge Instructions (Signed)
Please return to emergency department for fever, increased pain, uncontrolled vomiting, or any other new concerns. [facility] to [facility] with [facility] for [facility] outpatient assessment 51-year-old stents and whether or not they can be removed. Continue with your supplemental lipase at home and tried to advance her diet as tolerated. Primarily a liquid diet for the next 24 hours  Please return immediately if condition worsens. Please contact her primary physician or the physician you were given for referral. If you have any specialist physicians involved in her treatment and plan please also contact them. Thank you for using  regional emergency Department.

## 2016-09-27 NOTE — ED Notes (Signed)
Attempted IV access x,1 unsuccessful. Patient transported to radiology.

## 2016-09-27 NOTE — ED Notes (Signed)
Pt up to restroom. Assisted by staff due to unsteady gait. Pt states her pain remains improved. Ice chips provided.

## 2016-09-27 NOTE — ED Provider Notes (Signed)
Time Seen: Approximately 1836  I have reviewed the triage notes  Chief Complaint: Abdominal Pain   History of Present Illness: Susan Galvan is a 51 y.o. female who has a significant history of pancreatitis. She states that she has 2 stents in her biliary system and has had a previous back to me. She states she was supposed to have the stents removed but is never proceeded to do so. She states that she's new to the area and most of her studies and surgeries have all been performed in the [place] area. She arrives with no records. Patient states that she's having her typical pancreatitis type pain and points primarily to the epigastric area. She states she is on chronic lipase.   History reviewed. No pertinent past medical history.  There are no active problems to display for this patient.   History reviewed. No pertinent surgical history.  History reviewed. No pertinent surgical history. Patient's had an appendectomy, cholecystectomy, hysterectomy. 2 biliary stents    Allergies:  Compazine [prochlorperazine edisylate]; Ketorolac; Phenergan [promethazine]; and Reglan [metoclopramide]  Family History: No family history on file.  Social History: Social History  Substance Use Topics  . Smoking status: Never Smoker  . Smokeless tobacco: Never Used  . Alcohol use No     Review of Systems:   10 point review of systems was performed and was otherwise negative:  Constitutional: No fever Eyes: No visual disturbances ENT: No sore throat, ear pain Cardiac: No chest pain Respiratory: No shortness of breath, wheezing, or stridor Abdomen: Epigastric abdominal pain. States her last bowel movement was this morning with no melena or hematochezia. Endocrine: No weight loss, No night sweats Extremities: No peripheral edema, cyanosis Skin: No rashes, easy bruising Neurologic: No focal weakness, trouble with speech or swollowing Urologic: No dysuria, Hematuria, or urinary  frequency   Physical Exam:  ED Triage Vitals  Enc Vitals Group     BP 09/27/16 1631 (!) 106/46     Pulse Rate 09/27/16 1631 93     Resp 09/27/16 1631 18     Temp 09/27/16 1631 98.2 F (36.8 C)     Temp Source 09/27/16 1631 Oral     SpO2 09/27/16 1631 100 %     Weight 09/27/16 1631 112 lb (50.8 kg)     Height 09/27/16 1631 5\' 9"  (1.753 m)     Head Circumference --      Peak Flow --      Pain Score 09/27/16 1632 8     Pain Loc --      Pain Edu? --      Excl. in GC? --     General: Awake , Alert , and Oriented times 3; GCS 15 Head: Normal cephalic , atraumatic Eyes: Pupils equal , round, reactive to light Nose/Throat: No nasal drainage, patent upper airway without erythema or exudate.  Neck: Supple, Full range of motion, No anterior adenopathy or palpable thyroid masses Lungs: Clear to ascultation without wheezes , rhonchi, or rales Heart: Regular rate, regular rhythm without murmurs , gallops , or rubs Abdomen: Soft, non tender without rebound, guarding , or rigidity; bowel sounds positive and symmetric in all 4 quadrants. No organomegaly .        Extremities: 2 plus symmetric pulses. No edema, clubbing or cyanosis Neurologic: normal ambulation, Motor symmetric without deficits, sensory intact Skin: warm, dry, no rashes   Labs:   All laboratory work was reviewed including any pertinent negatives or positives listed below:  Labs  Reviewed  LIPASE, BLOOD - Abnormal; Notable for the following:       Result Value   Lipase 78 (*)    All other components within normal limits  COMPREHENSIVE METABOLIC PANEL - Abnormal; Notable for the following:    Potassium 5.4 (*)    BUN 22 (*)    ALT 9 (*)    All other components within normal limits  CBC - Abnormal; Notable for the following:    Hemoglobin 10.9 (*)    HCT 33.9 (*)    RDW 14.9 (*)    All other components within normal limits  URINALYSIS, COMPLETE (UACMP) WITH MICROSCOPIC - Abnormal; Notable for the following:    Color,  Urine YELLOW (*)    APPearance CLEAR (*)    Leukocytes, UA SMALL (*)    Squamous Epithelial / LPF 0-5 (*)    All other components within normal limits  Patient's lipase is slightly elevated otherwise her lab work appears to be within normal limits   Radiology:  "Dg Abd Acute W/chest  Result Date: 09/27/2016 CLINICAL DATA:  Acute onset of generalized abdominal pain, nausea and vomiting. Initial encounter. EXAM: DG ABDOMEN ACUTE W/ 1V CHEST COMPARISON:  None. FINDINGS: The lungs are well-aerated and clear. There is no evidence of focal opacification, pleural effusion or pneumothorax. The cardiomediastinal silhouette is within normal limits. The visualized bowel gas pattern is unremarkable. Scattered stool and air are seen within the colon; there is no evidence of small bowel dilatation to suggest obstruction. No free intra-abdominal air is identified on the provided upright view. Biliary stents are noted; they appear somewhat more inferiorly positioned than expected. Clips are noted within the right upper quadrant, reflecting prior cholecystectomy. No acute osseous abnormalities are seen; the sacroiliac joints are unremarkable in appearance. IMPRESSION: 1. Unremarkable bowel gas pattern; no free intra-abdominal air seen. Small amount of stool noted in the colon. 2. No acute cardiopulmonary process seen. 3. Biliary stents appear somewhat more inferiorly positioned than expected, and may have extended partially into the duodenum. Would correlate with prior operative note. This could be further assessed on CT, if not previously assessed, as deemed clinically appropriate. Electronically Signed   By: Roanna Raider M.D.   On: 09/27/2016 19:18  "  I personally reviewed the radiologic studies    ED Course: Patient's stay here was uneventful and showed some gradual improvement with IV fluids, IV Zofran, and IV Dilaudid. Patient seems to also have an anxiety component and was given IV Ativan here in  emergency department and had pain control. She still has some epigastric abdominal pain. It does not appear that she has a bowel obstruction and I felt this was unlikely to be severe pancreatitis giving the only slightly elevated levels of her lipase otherwise her electrolytes are within normal limits, especially her calcium. She has a normal white blood cell count. The patient was advised that she may need to seek evaluation at a tertiary care centers we do not handle biliary stents here at [facility]. She has some mild pancreatitis and I felt could be treated on a short-term basis with pain control and gradual advancement of her diet. She was advised to continue with the lipase that she has at home     Assessment: * Acute exacerbation of chronic pancreatitis      Plan: * Outpatient " New Prescriptions   LORAZEPAM (ATIVAN) 1 MG TABLET    Take 1 tablet (1 mg total) by mouth 2 (two) times daily  as needed for anxiety.   ONDANSETRON (ZOFRAN ODT) 4 MG DISINTEGRATING TABLET    Take 1 tablet (4 mg total) by mouth every 8 (eight) hours as needed for nausea or vomiting.   TRAMADOL (ULTRAM) 50 MG TABLET    Take 1 tablet (50 mg total) by mouth every 6 (six) hours as needed.  " Patient was advised to return immediately if condition worsens. Patient was advised to follow up with their primary care physician or other specialized physicians involved in their outpatient care. The patient and/or family member/power of attorney had laboratory results reviewed at the bedside. All questions and concerns were addressed and appropriate discharge instructions were distributed by the nursing staff.             Jennye MoccasinBrian S Anise Harbin, MD 09/27/16 [phone]

## 2016-09-28 ENCOUNTER — Emergency Department
Admission: EM | Admit: 2016-09-28 | Discharge: 2016-09-28 | Disposition: A | Discharge status: Home / Self Care | Payer: Self-pay | Attending: Emergency Medicine | Admitting: Emergency Medicine

## 2016-09-28 ENCOUNTER — Encounter: Payer: Self-pay | Admitting: Emergency Medicine

## 2016-09-28 ENCOUNTER — Emergency Department: Payer: Self-pay

## 2016-09-28 DIAGNOSIS — R1084 Generalized abdominal pain: Secondary | ICD-10-CM

## 2016-09-28 DIAGNOSIS — R1013 Epigastric pain: Secondary | ICD-10-CM | POA: Insufficient documentation

## 2016-09-28 DIAGNOSIS — R112 Nausea with vomiting, unspecified: Secondary | ICD-10-CM | POA: Insufficient documentation

## 2016-09-28 LAB — CBC WITH DIFFERENTIAL/PLATELET
BASOS ABS: 0 10*3/uL (ref 0–0.1)
Basophils Relative: 0 %
EOS ABS: 0.1 10*3/uL (ref 0–0.7)
EOS PCT: 3 %
HCT: 29.8 % — ABNORMAL LOW (ref 35.0–47.0)
Hemoglobin: 9.8 g/dL — ABNORMAL LOW (ref 12.0–16.0)
Lymphocytes Relative: 22 %
Lymphs Abs: 0.9 10*3/uL — ABNORMAL LOW (ref 1.0–3.6)
MCH: 29.4 pg (ref 26.0–34.0)
MCHC: 32.9 g/dL (ref 32.0–36.0)
MCV: 89.2 fL (ref 80.0–100.0)
Monocytes Absolute: 0.3 10*3/uL (ref 0.2–0.9)
Monocytes Relative: 8 %
Neutro Abs: 2.5 10*3/uL (ref 1.4–6.5)
Neutrophils Relative %: 67 %
Platelets: 229 10*3/uL (ref 150–440)
RBC: 3.34 MIL/uL — AB (ref 3.80–5.20)
RDW: 15 % — ABNORMAL HIGH (ref 11.5–14.5)
WBC: 3.8 10*3/uL (ref 3.6–11.0)

## 2016-09-28 LAB — COMPREHENSIVE METABOLIC PANEL
ALT: 10 U/L — AB (ref 14–54)
AST: 17 U/L (ref 15–41)
Albumin: 4.1 g/dL (ref 3.5–5.0)
Alkaline Phosphatase: 72 U/L (ref 38–126)
Anion gap: 6 (ref 5–15)
BUN: 16 mg/dL (ref 6–20)
CHLORIDE: 104 mmol/L (ref 101–111)
CO2: 27 mmol/L (ref 22–32)
CREATININE: 0.63 mg/dL (ref 0.44–1.00)
Calcium: 8.9 mg/dL (ref 8.9–10.3)
GFR calc non Af Amer: >60 mL/min (ref >60)
Glucose, Bld: 83 mg/dL (ref 65–99)
Potassium: 3.8 mmol/L (ref 3.5–5.1)
SODIUM: 137 mmol/L (ref 135–145)
Total Bilirubin: 0.4 mg/dL (ref 0.3–1.2)
Total Protein: 7 g/dL (ref 6.5–8.1)

## 2016-09-28 LAB — ETHANOL

## 2016-09-28 LAB — LIPASE, BLOOD: Lipase: 56 U/L — ABNORMAL HIGH (ref 11–51)

## 2016-09-28 MED ORDER — HALOPERIDOL LACTATE 5 MG/ML IJ SOLN
5.0000 mg | Freq: Once | INTRAMUSCULAR | Status: AC
  Administered 2016-09-28: 5 mg via INTRAVENOUS
  Filled 2016-09-28: qty 1

## 2016-09-28 MED ORDER — IOPAMIDOL (ISOVUE-300) INJECTION 61%
100.0000 mL | Freq: Once | INTRAVENOUS | Status: AC | PRN
  Administered 2016-09-28: 100 mL via INTRAVENOUS

## 2016-09-28 MED ORDER — IOPAMIDOL (ISOVUE-300) INJECTION 61%
30.0000 mL | INTRAVENOUS | Status: AC
  Administered 2016-09-28: 30 mL via ORAL

## 2016-09-28 MED ORDER — ONDANSETRON HCL 4 MG/2ML IJ SOLN
4.0000 mg | Freq: Once | INTRAMUSCULAR | Status: AC
  Administered 2016-09-28: 4 mg via INTRAVENOUS
  Filled 2016-09-28: qty 2

## 2016-09-28 MED ORDER — DICYCLOMINE HCL 20 MG PO TABS: 20.0000 mg | ORAL_TABLET | Freq: Three times a day (TID) | ORAL | 0 refills | Status: DC | PRN

## 2016-09-28 MED ORDER — DICYCLOMINE HCL 10 MG/ML IM SOLN
20.0000 mg | Freq: Once | INTRAMUSCULAR | Status: AC
  Administered 2016-09-28: 20 mg via INTRAMUSCULAR
  Filled 2016-09-28: qty 2

## 2016-09-28 MED ORDER — SODIUM CHLORIDE 0.9 % IV BOLUS (SEPSIS)
1000.0000 mL | Freq: Once | INTRAVENOUS | Status: AC
  Administered 2016-09-28: 1000 mL via INTRAVENOUS
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
## 2016-09-28 NOTE — ED Provider Notes (Signed)
-----------------------------------------   9:18 AM on 09/28/2016 -----------------------------------------   Blood pressure 108/75, pulse 98, temperature 97.3 F (36.3 C), temperature source Oral, resp. rate 14, height 5\' 9"  (1.753 m), weight 112 lb (50.8 kg), SpO2 100 %.  Assuming care from Dr. Dolores FrameSung.  In short, Susan Galvan is a 51 y.o. female with a chief complaint of Abdominal Pain .  Refer to the original H&P for additional details.  The current plan of care is to discharge home. She received intravenous haloperidol and intramuscular dicyclomine as well as a CT scan of her abdomen pelvis which is fortunately negative for acute pathology. The patient is asking to leave the emergency department. She is medically stable for outpatient management.Merrily Brittle.        Gurvir Schrom, MD 09/28/16 [phone]

## 2016-09-28 NOTE — Discharge Instructions (Signed)
Please return to the emergency department for any new or worsening symptoms such as worsening pain fevers chills if he cannot eat or drink or for any other concerns. Otherwise follow up with her primary care physician as already scheduled.

## 2016-09-28 NOTE — ED Triage Notes (Signed)
Pt to room 4 via w/c actively vomiting green emesis; st was seen and d/c feeling better but began having return of N/V/abd pain while in the lobby waiting
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
## 2016-09-28 NOTE — ED Notes (Signed)
Pt actively vomiting in lobby; charge nurse notified

## 2016-10-12 ENCOUNTER — Emergency Department: Payer: No Typology Code available for payment source

## 2016-10-12 ENCOUNTER — Emergency Department
Admission: EM | Admit: 2016-10-12 | Discharge: 2016-10-13 | Disposition: A | Discharge status: Home / Self Care | Payer: No Typology Code available for payment source | Attending: Emergency Medicine | Admitting: Emergency Medicine

## 2016-10-12 ENCOUNTER — Encounter: Payer: Self-pay | Admitting: Emergency Medicine

## 2016-10-12 DIAGNOSIS — Y92410 Unspecified street and highway as the place of occurrence of the external cause: Secondary | ICD-10-CM | POA: Diagnosis not present

## 2016-10-12 DIAGNOSIS — Z79899 Other long term (current) drug therapy: Secondary | ICD-10-CM | POA: Diagnosis not present

## 2016-10-12 DIAGNOSIS — M25562 Pain in left knee: Secondary | ICD-10-CM | POA: Diagnosis not present

## 2016-10-12 DIAGNOSIS — R51 Headache: Secondary | ICD-10-CM | POA: Insufficient documentation

## 2016-10-12 DIAGNOSIS — Y999 Unspecified external cause status: Secondary | ICD-10-CM | POA: Diagnosis not present

## 2016-10-12 DIAGNOSIS — Y9389 Activity, other specified: Secondary | ICD-10-CM | POA: Insufficient documentation

## 2016-10-12 DIAGNOSIS — M25512 Pain in left shoulder: Secondary | ICD-10-CM | POA: Insufficient documentation

## 2016-10-12 DIAGNOSIS — M546 Pain in thoracic spine: Secondary | ICD-10-CM | POA: Diagnosis not present

## 2016-10-12 DIAGNOSIS — S0990XA Unspecified injury of head, initial encounter: Secondary | ICD-10-CM | POA: Diagnosis present

## 2016-10-12 HISTORY — DX: Fibromyalgia: M79.7

## 2016-10-12 MED ORDER — CYCLOBENZAPRINE HCL 10 MG PO TABS
5.0000 mg | ORAL_TABLET | Freq: Once | ORAL | Status: AC
  Administered 2016-10-12: 5 mg via ORAL
  Filled 2016-10-12: qty 1

## 2016-10-12 MED ORDER — CYCLOBENZAPRINE HCL 5 MG PO TABS: 5.0000 mg | ORAL_TABLET | Freq: Three times a day (TID) | ORAL | 0 refills | Status: AC | PRN

## 2016-10-12 MED ORDER — ONDANSETRON 4 MG PO TBDP: 4.0000 mg | ORAL_TABLET | Freq: Three times a day (TID) | ORAL | 0 refills | Status: AC | PRN

## 2016-10-12 MED ORDER — ONDANSETRON 4 MG PO TBDP
4.0000 mg | ORAL_TABLET | Freq: Once | ORAL | Status: AC
  Administered 2016-10-12: 4 mg via ORAL
  Filled 2016-10-12: qty 1
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
## 2016-10-12 NOTE — ED Notes (Signed)
Patient transported to CT 

## 2016-10-12 NOTE — ED Notes (Signed)
Patient transported to X-ray 
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
## 2016-10-13 NOTE — ED Notes (Signed)
Patient requesting nausea medication at discharge. MD Paduchowski informed. MD prescribed Zofran. RN reviewed prescriptions, d/c instructions, follow-up care, use of ice/elevation, OTC pain medication with patient. Pt verbalized understanding. Pt requesting prescription for pain medication. Pt informed that RN had discussed this with provider and provider declined to order prescription for pain medication. Provider ordered patient to take OTC tylenol for pain, and prescribed muscle relaxer. PT requesting to speak with leadership. Charge RN AssurantMichele informed. Michele at patient bedside.

## 2016-11-16 ENCOUNTER — Encounter: Payer: Self-pay | Admitting: Emergency Medicine

## 2016-11-16 ENCOUNTER — Ambulatory Visit
Admission: EM | Admit: 2016-11-16 | Discharge: 2016-11-16 | Disposition: A | Discharge status: Home / Self Care | Payer: Self-pay | Attending: Family Medicine | Admitting: Family Medicine

## 2016-11-16 DIAGNOSIS — S161XXA Strain of muscle, fascia and tendon at neck level, initial encounter: Secondary | ICD-10-CM

## 2016-11-16 DIAGNOSIS — M5412 Radiculopathy, cervical region: Secondary | ICD-10-CM

## 2016-11-16 DIAGNOSIS — F419 Anxiety disorder, unspecified: Secondary | ICD-10-CM

## 2016-11-16 HISTORY — DX: Acute pancreatitis without necrosis or infection, unspecified: K85.90

## 2016-11-16 MED ORDER — TRAMADOL HCL 50 MG PO TABS: 50.0000 mg | ORAL_TABLET | Freq: Four times a day (QID) | ORAL | 0 refills | Status: AC | PRN

## 2016-11-16 MED ORDER — METHYLPREDNISOLONE 4 MG PO TBPK: ORAL_TABLET | ORAL | 0 refills | Status: AC

## 2016-11-16 MED ORDER — DICYCLOMINE HCL 20 MG PO TABS: 20.0000 mg | ORAL_TABLET | Freq: Three times a day (TID) | ORAL | 0 refills | Status: AC | PRN

## 2016-11-16 MED ORDER — BUTALBITAL-ACETAMINOPHEN 50-325 MG PO TABS: 1.0000 | ORAL_TABLET | Freq: Four times a day (QID) | ORAL | 0 refills | Status: AC | PRN
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
